# Patient Record
Sex: Male | Born: 1995 | Race: Black or African American | Hispanic: No | Marital: Single | Smoking: Current some day smoker
Health system: Southern US, Community
[De-identification: ages and names within clinical notes are randomized; demographics above are authoritative.]

---

## 2017-09-21 ENCOUNTER — Encounter (HOSPITAL_COMMUNITY): Payer: Self-pay

## 2017-09-21 ENCOUNTER — Emergency Department (HOSPITAL_COMMUNITY)
Admission: EM | Admit: 2017-09-21 | Discharge: 2017-09-21 | Disposition: A | Discharge status: Home / Self Care | Payer: Self-pay | Attending: Emergency Medicine | Admitting: Emergency Medicine

## 2017-09-21 ENCOUNTER — Other Ambulatory Visit: Payer: Self-pay

## 2017-09-21 ENCOUNTER — Emergency Department (HOSPITAL_COMMUNITY): Payer: Self-pay

## 2017-09-21 DIAGNOSIS — W2209XA Striking against other stationary object, initial encounter: Secondary | ICD-10-CM | POA: Insufficient documentation

## 2017-09-21 DIAGNOSIS — Y929 Unspecified place or not applicable: Secondary | ICD-10-CM | POA: Insufficient documentation

## 2017-09-21 DIAGNOSIS — M79641 Pain in right hand: Secondary | ICD-10-CM

## 2017-09-21 DIAGNOSIS — S60519A Abrasion of unspecified hand, initial encounter: Secondary | ICD-10-CM

## 2017-09-21 DIAGNOSIS — Y998 Other external cause status: Secondary | ICD-10-CM | POA: Insufficient documentation

## 2017-09-21 DIAGNOSIS — S60511A Abrasion of right hand, initial encounter: Secondary | ICD-10-CM | POA: Insufficient documentation

## 2017-09-21 DIAGNOSIS — Y9389 Activity, other specified: Secondary | ICD-10-CM | POA: Insufficient documentation

## 2017-09-21 NOTE — ED Triage Notes (Signed)
Per Pt, Pt is coming from home with complaints of right hand pain secondary to hitting a brick wall. Pt has abrasion noted to the right hand.

## 2017-09-21 NOTE — ED Provider Notes (Signed)
[facility]ENC[facility] Provider Note   CSN: [id number] Arrival date & time: 09/21/17  1253     History   Chief Complaint Chief Complaint  Patient presents with  . Hand Injury    HPI Lee Olson is a 22 y.o. male.  Lee Olson is a 22 y.o. Male who is otherwise healthy, presents to the ED for evaluation of right hand pain after punching a brick wall.  Patient reports he punched the wall because he was angry, since then he has had pain over his knuckles, pain is constant dull ache and not improving.  No pain at the wrist, patient has full use of his hand and is able to move all of his fingers. Patient does report some abrasions over his knuckles.  No numbness, tingling or weakness in the hand.  No other injuries.  No meds prior to arrival.  No other aggravating or alleviating factors.      History reviewed. No pertinent past medical history.  There are no active problems to display for this patient.   History reviewed. No pertinent surgical history.     Home Medications    Prior to Admission medications   Not on File    Family History No family history on file.  Social History Social History   Tobacco Use  . Smoking status: Never Smoker  . Smokeless tobacco: Never Used  Substance Use Topics  . Alcohol use: No    Frequency: Never  . Drug use: No     Allergies   Patient has no known allergies.   Review of Systems Review of Systems  Constitutional: Negative for chills and fever.  Musculoskeletal: Positive for arthralgias (R hand pain). Negative for joint swelling.  Skin: Positive for wound (Abrasions to knuckles in right hand).  Neurological: Negative for weakness and numbness.     Physical Exam Updated Vital Signs BP (!) 95/55 (BP Location: Left Arm)   Pulse 92   Temp 99.1 F (37.3 C) (Oral)   Resp 14   Ht 5\' 8"  (1.727 m)   Wt 59 kg (130 lb)   SpO2 99%   BMI 19.77 kg/m   Physical Exam  Constitutional: He  appears well-developed and well-nourished. No distress.  HENT:  Head: Normocephalic and atraumatic.  Eyes: Right eye exhibits no discharge. Left eye exhibits no discharge.  Pulmonary/Chest: Effort normal. No respiratory distress.  Musculoskeletal:  Palpation over the knuckles, no palpable deformity, 3 small surface abrasions present, no pain on palpation of the fingers, metacarpals or the wrist, full range of motion of the wrist and fingers, radial pulse 2+, good capillary refill, sensation intact.  Neurological: He is alert. Coordination normal.  Skin: Skin is warm and dry. Capillary refill takes less than 2 seconds. He is not diaphoretic.  Psychiatric: He has a normal mood and affect. His behavior is normal.  Nursing note and vitals reviewed.    ED Treatments / Results  Labs (all labs ordered are listed, but only abnormal results are displayed) Labs Reviewed - No data to display  EKG  EKG Interpretation None       Radiology Dg Hand Complete Right  Result Date: 09/21/2017 CLINICAL DATA:  Pt punched a brick wall with his right fist today - having pain and swelling around the 4th and 5th MCP joint areas - also some abrasions in those areas EXAM: RIGHT HAND - COMPLETE 3+ VIEW COMPARISON:  None. FINDINGS: There is no evidence of fracture or dislocation. There is  no evidence of arthropathy or other focal bone abnormality. Soft tissues are unremarkable. IMPRESSION: No acute osseous injury of the right hand. Electronically Signed   By: Elige Ko   On: 09/21/2017 13:57    Procedures Procedures (including critical care time)  Medications Ordered in ED Medications - No data to display   Initial Impression / Assessment and Plan / ED Course  I have reviewed the triage vital signs and the nursing notes.  Pertinent labs & imaging results that were available during my care of the patient were reviewed by me and considered in my medical decision making (see chart for  details).  Patient presents for evaluation of right hand pain punching a brick wall.  Hand is completely neurovascularly intact.  X-ray shows no evidence of acute fracture.  3 small surface abrasions washed out with soap and water, bacitracin ointment applied as well as dressing.  Patient to use ibuprofen or Tylenol as needed for pain.  Return precautions discussed with the patient.  He expressed understanding and is in agreement with plan, will follow up with PCP.     Final Clinical Impressions(s) / ED Diagnoses   Final diagnoses:  Right hand pain  Abrasion, hand w/o infection    ED Discharge Orders    None        Dartha Lodge, [place] 09/21/17 1432    Mancel Bale, MD 09/23/17 [phone]

## 2017-09-21 NOTE — Discharge Instructions (Signed)
Your x-ray shows no evidence of fracture.  Please keep abrasions covered, clean and dry until scabs form, if you note any swelling, redness, warmth or drainage from the wounds please return to the ED for reevaluation.  Otherwise you may establish care at [facility].  Please find healthier ways to express your anger.

## 2017-11-20 ENCOUNTER — Other Ambulatory Visit: Payer: Self-pay

## 2017-11-20 ENCOUNTER — Emergency Department (HOSPITAL_COMMUNITY)
Admission: EM | Admit: 2017-11-20 | Discharge: 2017-11-20 | Disposition: A | Discharge status: Home / Self Care | Payer: Self-pay | Attending: Emergency Medicine | Admitting: Emergency Medicine

## 2017-11-20 ENCOUNTER — Emergency Department (HOSPITAL_COMMUNITY): Payer: Self-pay

## 2017-11-20 ENCOUNTER — Encounter (HOSPITAL_COMMUNITY): Payer: Self-pay

## 2017-11-20 DIAGNOSIS — F17200 Nicotine dependence, unspecified, uncomplicated: Secondary | ICD-10-CM | POA: Insufficient documentation

## 2017-11-20 DIAGNOSIS — R059 Cough, unspecified: Secondary | ICD-10-CM

## 2017-11-20 DIAGNOSIS — R05 Cough: Secondary | ICD-10-CM | POA: Insufficient documentation

## 2017-11-20 MED ORDER — ALBUTEROL SULFATE HFA 108 (90 BASE) MCG/ACT IN AERS: 1.0000 | INHALATION_SPRAY | Freq: Four times a day (QID) | RESPIRATORY_TRACT | 0 refills | Status: AC | PRN

## 2017-11-20 MED ORDER — BENZONATATE 100 MG PO CAPS: 100.0000 mg | ORAL_CAPSULE | Freq: Three times a day (TID) | ORAL | 0 refills | Status: DC

## 2017-11-20 NOTE — Discharge Instructions (Signed)
Take albuterol inhaler as directed for cough.  Use Tessalon Perles as directed for cough.  Make sure you are staying hydrated and drink plenty of fluids.  [facility] for [facility].  Return to the emergency department for any fever, persistent vomiting, inability to eat or drink, abdominal pain, chest pain or any other worsening or concerning symptoms.

## 2017-11-20 NOTE — ED Triage Notes (Signed)
Pt states that he has had coughing and vomiting for a few days. Pt reports vomiting 3X in last 24 hours. Denies diarrhea. Cough is nonproductive.

## 2017-11-20 NOTE — ED Provider Notes (Signed)
[facility] EMERGENCY DEPARTMENT Provider Note   CSN: [id number] Arrival date & time: 11/20/17  1147     History   Chief Complaint Chief Complaint  Patient presents with  . Cough    HPI Lee Olson is a 22 y.o. male who presents for evaluation of cough that is been ongoing for the last 3 days.  Patient reports that after several episodes of coughing, he will have vomiting.  Patient only has vomiting after coughing.  He has been able to eat and drink without difficulty.  No vomiting at other times.  He reports the cough is nonproductive.  Patient reports that he had an albuterol inhaler that he had been using for cough but states he ran out. Patient states he is not having fevers.  Patient denies any chest pain, difficulty breathing, abdominal pain, sore throat, nasal congestion.  The history is provided by the patient.    History reviewed. No pertinent past medical history.  There are no active problems to display for this patient.   History reviewed. No pertinent surgical history.      Home Medications    Prior to Admission medications   Medication Sig Start Date End Date Taking? Authorizing Provider  albuterol (PROVENTIL HFA;VENTOLIN HFA) 108 (90 Base) MCG/ACT inhaler Inhale 1-2 puffs into the lungs every 6 (six) hours as needed for wheezing or shortness of breath. 11/20/17   Maxwell Caul, PA-C  benzonatate (TESSALON) 100 MG capsule Take 1 capsule (100 mg total) by mouth every 8 (eight) hours. 11/20/17   Maxwell Caul, PA-C    Family History No family history on file.  Social History Social History   Tobacco Use  . Smoking status: Current Some Day Smoker  . Smokeless tobacco: Never Used  Substance Use Topics  . Alcohol use: No    Frequency: Never  . Drug use: No     Allergies   Patient has no known allergies.   Review of Systems Review of Systems  Constitutional: Negative for fever.  HENT: Negative for congestion and  rhinorrhea.   Respiratory: Positive for cough. Negative for shortness of breath.   Cardiovascular: Negative for chest pain.  Gastrointestinal: Positive for vomiting. Negative for diarrhea.     Physical Exam Updated Vital Signs BP 113/82 (BP Location: Right Arm)   Pulse 86   Temp 98.3 F (36.8 C) (Oral)   Ht 5\' 8"  (1.727 m)   Wt 59 kg (130 lb)   SpO2 98%   BMI 19.77 kg/m   Physical Exam  Constitutional: He appears well-developed and well-nourished.  HENT:  Head: Normocephalic and atraumatic.  Nose: Mucosal edema present.  Mouth/Throat: Uvula is midline, oropharynx is clear and moist and mucous membranes are normal. No trismus in the jaw.  Eyes: Conjunctivae and EOM are normal. Right eye exhibits no discharge. Left eye exhibits no discharge. No scleral icterus.  Cardiovascular: Normal rate, regular rhythm and normal pulses.  Pulmonary/Chest: Effort normal and breath sounds normal. He has no wheezes.  No evidence of respiratory distress. Able to speak in full sentences without difficulty.  Abdominal: Soft. Normal appearance and bowel sounds are normal. There is no tenderness.  Abdomen is soft, non-distended, non-tender.  No rigidity, guarding.  Neurological: He is alert.  Skin: Skin is warm and dry.  Psychiatric: He has a normal mood and affect. His speech is normal and behavior is normal.  Nursing note and vitals reviewed.    ED Treatments / Results  Labs (all labs  ordered are listed, but only abnormal results are displayed) Labs Reviewed - No data to display  EKG None  Radiology Dg Chest 2 View  Result Date: 11/20/2017 CLINICAL DATA:  Cough and vomiting for the past 3 days. EXAM: CHEST - 2 VIEW COMPARISON:  None. FINDINGS: Normal sized heart. Clear lungs. Mild central peribronchial thickening. Unremarkable bones. IMPRESSION: Mild bronchitic changes. Electronically Signed   By: Beckie SaltsSteven  Reid M.D.   On: 11/20/2017 13:50    Procedures Procedures (including critical  care time)  Medications Ordered in ED Medications - No data to display   Initial Impression / Assessment and Plan / ED Course  I have reviewed the triage vital signs and the nursing notes.  Pertinent labs & imaging results that were available during my care of the patient were reviewed by me and considered in my medical decision making (see chart for details).     22 year old male who presents for evaluation of cough.  Patient also reports he is having episodes of posttussive emesis.  Patient reports he is only having episodes of emesis after coughing.  No other times.  He is able to eat and drink without any difficulty.  No abdominal pain, fevers.  Patient reports history of bronchitis.  Has an albuterol that he had been using but states he ran out.  No other medications noted. Patient is afebrile, non-toxic appearing, sitting comfortably on examination table. Vital signs reviewed and stable.  On exam, lungs clear to auscultate bilaterally.  No evidence of wheezing.  Abdomen is soft, nondistended, nontender.  Suspect this may be an upper respiratory versus bronchitis with posttussive emesis.  History/physical exam is not concerning for acute viral GI process, appendicitis or diverticulitis.  Plan for chest x-ray for evaluation.  Chest x-ray reviewed.  Negative for any acute abnormalities.  I discussed results with patient.  We will plan to send home home with symptomatic relief for cough.  Instructed him to follow-up with primary care doctor.  Provide [facility] for further primary care evaluation. Patient had ample opportunity for questions and discussion. All patient's questions were answered with full understanding. Strict return precautions discussed. Patient expresses understanding and agreement to plan.    Final Clinical Impressions(s) / ED Diagnoses   Final diagnoses:  Cough    ED Discharge Orders        Ordered    benzonatate (TESSALON) 100 MG  capsule  Every 8 hours     11/20/17 1421    albuterol (PROVENTIL HFA;VENTOLIN HFA) 108 (90 Base) MCG/ACT inhaler  Every 6 hours PRN     11/20/17 1421       Maxwell CaulLayden, Lindsey A, PA-C 11/20/17 1749    Alvira MondaySchlossman, Erin, MD 11/20/17 2342

## 2018-01-17 ENCOUNTER — Emergency Department (HOSPITAL_COMMUNITY)
Admission: EM | Admit: 2018-01-17 | Discharge: 2018-01-17 | Disposition: A | Discharge status: Home / Self Care | Payer: Self-pay | Attending: Emergency Medicine | Admitting: Emergency Medicine

## 2018-01-17 ENCOUNTER — Other Ambulatory Visit: Payer: Self-pay

## 2018-01-17 DIAGNOSIS — S6991XA Unspecified injury of right wrist, hand and finger(s), initial encounter: Secondary | ICD-10-CM

## 2018-01-17 DIAGNOSIS — M79641 Pain in right hand: Secondary | ICD-10-CM | POA: Insufficient documentation

## 2018-01-17 NOTE — ED Notes (Signed)
Pt called for triage x2, no answer. 

## 2018-01-17 NOTE — ED Notes (Signed)
Patient called x 2 with no answer 

## 2018-01-17 NOTE — ED Provider Notes (Signed)
Patient placed in Quick Look pathway, seen and evaluated   Chief Complaint: Pt reports he jammed his finger.  Pt complains of pain in his hand and 3rd finger.  Pt has pain with movement.   HPI:   Pt complains of continued swelling and pain in hie right hand  ROS: no numbness of tingling.    Physical Exam:   Gen: No distress  Neuro: Awake and Alert  Skin: Warm    Focused Exam: tender 3rd finger at base, pain with moving  his hand. nv and ns intact   Initiation of care has begun. The patient has been counseled on the process, plan, and necessity for staying for the completion/evaluation, and the remainder of the medical screening examination     Osie CheeksSofia, Mayme Profeta K, PA-C 01/17/18 1739    Wynetta FinesMessick, Peter C, MD 01/25/18 [phone]

## 2018-01-18 ENCOUNTER — Emergency Department (HOSPITAL_COMMUNITY)
Admission: EM | Admit: 2018-01-18 | Discharge: 2018-01-18 | Disposition: A | Discharge status: Home / Self Care | Payer: Self-pay | Attending: Emergency Medicine | Admitting: Emergency Medicine

## 2018-01-18 ENCOUNTER — Emergency Department (HOSPITAL_COMMUNITY): Payer: Self-pay

## 2018-01-18 ENCOUNTER — Encounter (HOSPITAL_COMMUNITY): Payer: Self-pay | Admitting: *Deleted

## 2018-01-18 DIAGNOSIS — W2209XA Striking against other stationary object, initial encounter: Secondary | ICD-10-CM | POA: Insufficient documentation

## 2018-01-18 DIAGNOSIS — S6991XA Unspecified injury of right wrist, hand and finger(s), initial encounter: Secondary | ICD-10-CM | POA: Insufficient documentation

## 2018-01-18 DIAGNOSIS — Y939 Activity, unspecified: Secondary | ICD-10-CM | POA: Insufficient documentation

## 2018-01-18 DIAGNOSIS — F17210 Nicotine dependence, cigarettes, uncomplicated: Secondary | ICD-10-CM | POA: Insufficient documentation

## 2018-01-18 DIAGNOSIS — Y999 Unspecified external cause status: Secondary | ICD-10-CM | POA: Insufficient documentation

## 2018-01-18 DIAGNOSIS — Y929 Unspecified place or not applicable: Secondary | ICD-10-CM | POA: Insufficient documentation

## 2018-01-18 NOTE — ED Provider Notes (Signed)
[facility] Provider Note   CSN: [id number] Arrival date & time: 01/18/18  1312    History   Chief Complaint Chief Complaint  Patient presents with  . Hand Injury    HPI Lee Olson is a 22 y.o. male.  HPI   22 year old male presents today with complaints of right hand.  Patient notes that he punched a wall today causing pain at the right fifth MCP.  He denies any open wounds, denies any pain throughout the rest of the hand or wrist.  No medications prior to arrival.  Patient is right-hand dominant works at [employer].    History reviewed. No pertinent past medical history.  There are no active problems to display for this patient.   History reviewed. No pertinent surgical history.      Home Medications    Prior to Admission medications   Medication Sig Start Date End Date Taking? Authorizing Provider  albuterol (PROVENTIL HFA;VENTOLIN HFA) 108 (90 Base) MCG/ACT inhaler Inhale 1-2 puffs into the lungs every 6 (six) hours as needed for wheezing or shortness of breath. 11/20/17   Maxwell CaulLayden, Lindsey A, PA-C  benzonatate (TESSALON) 100 MG capsule Take 1 capsule (100 mg total) by mouth every 8 (eight) hours. 11/20/17   Maxwell CaulLayden, Lindsey A, PA-C    Family History History reviewed. No pertinent family history.  Social History Social History   Tobacco Use  . Smoking status: Current Some Day Smoker  . Smokeless tobacco: Never Used  Substance Use Topics  . Alcohol use: No    Frequency: Never  . Drug use: No     Allergies   Patient has no known allergies.   Review of Systems Review of Systems  All other systems reviewed and are negative.    Physical Exam Updated Vital Signs BP 104/78 (BP Location: Right Arm)   Pulse (!) 58   Temp 98 F (36.7 C) (Oral)   Resp 20   SpO2 100%   Physical Exam  Constitutional: He is oriented to person, place, and time. He appears well-developed and well-nourished.  HENT:  Head:  Normocephalic and atraumatic.  Eyes: Pupils are equal, round, and reactive to light. Conjunctivae are normal. Right eye exhibits no discharge. Left eye exhibits no discharge. No scleral icterus.  Neck: Normal range of motion. No JVD present. No tracheal deviation present.  Pulmonary/Chest: Effort normal. No stridor.  Musculoskeletal:  Minor swelling over the right fifth MCP, full active range of motion of the fingers made her hand nontender, tenderness palpation of the MCP-no wrist or elbow tenderness  Neurological: He is alert and oriented to person, place, and time. Coordination normal.  Psychiatric: He has a normal mood and affect. His behavior is normal. Judgment and thought content normal.  Nursing note and vitals reviewed.    ED Treatments / Results  Labs (all labs ordered are listed, but only abnormal results are displayed) Labs Reviewed - No data to display  EKG None  Radiology Dg Hand Complete Right  Result Date: 01/18/2018 CLINICAL DATA:  Patient reported punching a wall with the right hand today with persistent swelling over the fifth MCP joint EXAM: RIGHT HAND - COMPLETE 3+ VIEW COMPARISON:  Right hand series of September 21, 2017 FINDINGS: The bones of the right hand are subjectively adequately mineralized. Specific attention to the fifth metacarpal reveals no acute fracture. There is considerable soft tissue swelling over the fifth MCP joint. The phalanges and first through fourth metacarpals are unremarkable. The carpal bones  are intact. IMPRESSION: There is no acute fracture or dislocation of the right hand. There is a large amount of soft tissue swelling over the fifth MCP joint. Electronically Signed   By: David  [place] M.D.   On: 01/18/2018 13:37    Procedures Procedures (including critical care time)  Medications Ordered in ED Medications - No data to display   Initial Impression / Assessment and Plan / ED Course  I have reviewed the triage vital signs and the  nursing notes.  Pertinent labs & imaging results that were available during my care of the patient were reviewed by me and considered in my medical decision making (see chart for details).     Labs:   Imaging: DG Hand   Consults:  Therapeutics:  Discharge Meds:   Assessment/Plan: 22 year old male presents today with hand injury.  Soft tissue swelling noted no acute bony abnormalities I personally reviewed the x-ray myself.  Patient discharged with symptomatic care and return precautions.  He verbalized understanding and agreement to today's plan had no further discharge.      Final Clinical Impressions(s) / ED Diagnoses   Final diagnoses:  Injury of right hand, initial encounter    ED Discharge Orders    None       Rosalio Loud 01/18/18 1359    Tilden Fossa, MD 01/19/18 [phone]

## 2018-01-18 NOTE — ED Notes (Signed)
Pt alert and oriented in NAD. Pt verbalized understanding of discharge instructions. 

## 2018-01-18 NOTE — ED Notes (Signed)
Pt called to go back to room. Pt in X-ray at this time. X-ray will bring pt back to room when finished.

## 2018-01-18 NOTE — Discharge Instructions (Addendum)
Please read attached information. If you experience any new or worsening signs or symptoms please return to the emergency room for evaluation. Please follow-up with your primary care provider or specialist as discussed.  °
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
## 2018-01-24 ENCOUNTER — Encounter (HOSPITAL_COMMUNITY): Payer: Self-pay

## 2018-01-24 ENCOUNTER — Other Ambulatory Visit: Payer: Self-pay

## 2018-01-24 ENCOUNTER — Emergency Department (HOSPITAL_COMMUNITY)
Admission: EM | Admit: 2018-01-24 | Discharge: 2018-01-24 | Disposition: A | Discharge status: Home / Self Care | Payer: Self-pay | Attending: Physician Assistant | Admitting: Physician Assistant

## 2018-01-24 DIAGNOSIS — S60511A Abrasion of right hand, initial encounter: Secondary | ICD-10-CM | POA: Insufficient documentation

## 2018-01-24 DIAGNOSIS — T07XXXA Unspecified multiple injuries, initial encounter: Secondary | ICD-10-CM

## 2018-01-24 DIAGNOSIS — S80212A Abrasion, left knee, initial encounter: Secondary | ICD-10-CM | POA: Insufficient documentation

## 2018-01-24 DIAGNOSIS — Y939 Activity, unspecified: Secondary | ICD-10-CM | POA: Insufficient documentation

## 2018-01-24 DIAGNOSIS — S60512A Abrasion of left hand, initial encounter: Secondary | ICD-10-CM | POA: Insufficient documentation

## 2018-01-24 DIAGNOSIS — S80211A Abrasion, right knee, initial encounter: Secondary | ICD-10-CM | POA: Insufficient documentation

## 2018-01-24 DIAGNOSIS — Z23 Encounter for immunization: Secondary | ICD-10-CM | POA: Insufficient documentation

## 2018-01-24 DIAGNOSIS — Y999 Unspecified external cause status: Secondary | ICD-10-CM | POA: Insufficient documentation

## 2018-01-24 DIAGNOSIS — Y929 Unspecified place or not applicable: Secondary | ICD-10-CM | POA: Insufficient documentation

## 2018-01-24 MED ORDER — TETANUS-DIPHTH-ACELL PERTUSSIS 5-2.5-18.5 LF-MCG/0.5 IM SUSP
0.5000 mL | Freq: Once | INTRAMUSCULAR | Status: AC
  Administered 2018-01-24: 0.5 mL via INTRAMUSCULAR
  Filled 2018-01-24: qty 0.5

## 2018-01-24 MED ORDER — LIDOCAINE HCL (PF) 1 % IJ SOLN
5.0000 mL | Freq: Once | INTRAMUSCULAR | Status: AC
  Administered 2018-01-24: 5 mL via INTRADERMAL
  Filled 2018-01-24: qty 5

## 2018-01-24 NOTE — ED Notes (Signed)
Wound soaking in saline and peroxide
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
## 2018-01-24 NOTE — Discharge Instructions (Signed)
Contact a health care provider if: You received a tetanus shot and you have swelling, severe pain, redness, or bleeding at the injection site. Your pain is not controlled with medicine. You have increased redness, swelling, or pain at the site of your wound. Get help right away if: You have a red streak going away from your wound. You have a fever. You have fluid, blood, or pus coming from your wound. You notice a bad smell coming from your wound or your dressing.
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
## 2018-08-13 ENCOUNTER — Emergency Department (HOSPITAL_COMMUNITY)
Admission: EM | Admit: 2018-08-13 | Discharge: 2018-08-13 | Disposition: A | Discharge status: Home / Self Care | Payer: No Typology Code available for payment source | Attending: Emergency Medicine | Admitting: Emergency Medicine

## 2018-08-13 ENCOUNTER — Emergency Department (HOSPITAL_COMMUNITY): Payer: No Typology Code available for payment source

## 2018-08-13 ENCOUNTER — Encounter (HOSPITAL_COMMUNITY): Payer: Self-pay | Admitting: Emergency Medicine

## 2018-08-13 DIAGNOSIS — S8002XA Contusion of left knee, initial encounter: Secondary | ICD-10-CM | POA: Diagnosis not present

## 2018-08-13 DIAGNOSIS — F17210 Nicotine dependence, cigarettes, uncomplicated: Secondary | ICD-10-CM | POA: Insufficient documentation

## 2018-08-13 DIAGNOSIS — Y999 Unspecified external cause status: Secondary | ICD-10-CM | POA: Insufficient documentation

## 2018-08-13 DIAGNOSIS — S8992XA Unspecified injury of left lower leg, initial encounter: Secondary | ICD-10-CM | POA: Diagnosis present

## 2018-08-13 DIAGNOSIS — Y92414 Local residential or business street as the place of occurrence of the external cause: Secondary | ICD-10-CM | POA: Diagnosis not present

## 2018-08-13 DIAGNOSIS — Y9389 Activity, other specified: Secondary | ICD-10-CM | POA: Insufficient documentation

## 2018-08-13 MED ORDER — IBUPROFEN 200 MG PO TABS
600.0000 mg | ORAL_TABLET | Freq: Once | ORAL | Status: AC
  Administered 2018-08-13: 600 mg via ORAL
  Filled 2018-08-13: qty 3

## 2018-08-13 MED ORDER — NAPROXEN 500 MG PO TABS: 500.0000 mg | ORAL_TABLET | Freq: Two times a day (BID) | ORAL | 0 refills | Status: AC
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
## 2019-11-01 ENCOUNTER — Encounter (HOSPITAL_COMMUNITY): Payer: Self-pay | Admitting: Psychiatric/Mental Health

## 2019-11-01 ENCOUNTER — Observation Stay (HOSPITAL_COMMUNITY)
Admission: AD | Admit: 2019-11-01 | Discharge: 2019-11-02 | Disposition: A | Discharge status: Home / Self Care | Payer: Self-pay | Attending: Psychiatry | Admitting: Psychiatry

## 2019-11-01 ENCOUNTER — Other Ambulatory Visit: Payer: Self-pay

## 2019-11-01 DIAGNOSIS — R4689 Other symptoms and signs involving appearance and behavior: Principal | ICD-10-CM | POA: Diagnosis present

## 2019-11-01 DIAGNOSIS — F17210 Nicotine dependence, cigarettes, uncomplicated: Secondary | ICD-10-CM | POA: Insufficient documentation

## 2019-11-01 DIAGNOSIS — Z791 Long term (current) use of non-steroidal anti-inflammatories (NSAID): Secondary | ICD-10-CM | POA: Insufficient documentation

## 2019-11-01 DIAGNOSIS — Z20822 Contact with and (suspected) exposure to covid-19: Secondary | ICD-10-CM | POA: Insufficient documentation

## 2019-11-01 LAB — RESPIRATORY PANEL BY RT PCR (FLU A&B, COVID)
Influenza A by PCR: NEGATIVE
Influenza B by PCR: NEGATIVE
SARS Coronavirus 2 by RT PCR: NEGATIVE

## 2019-11-01 MED ORDER — MAGNESIUM HYDROXIDE 400 MG/5ML PO SUSP: 30.0000 mL | Freq: Every day | ORAL | Status: DC | PRN

## 2019-11-01 MED ORDER — ACETAMINOPHEN 325 MG PO TABS: 650.0000 mg | ORAL_TABLET | Freq: Four times a day (QID) | ORAL | Status: DC | PRN

## 2019-11-01 MED ORDER — ALUM & MAG HYDROXIDE-SIMETH 200-200-20 MG/5ML PO SUSP: 30.0000 mL | ORAL | Status: DC | PRN

## 2019-11-01 MED ORDER — TRAZODONE HCL 50 MG PO TABS
50.0000 mg | ORAL_TABLET | Freq: Every evening | ORAL | Status: DC | PRN
  Administered 2019-11-01: 23:00:00 50 mg via ORAL
  Filled 2019-11-01: qty 1

## 2019-11-01 MED ORDER — HYDROXYZINE HCL 25 MG PO TABS
25.0000 mg | ORAL_TABLET | Freq: Three times a day (TID) | ORAL | Status: DC | PRN
  Administered 2019-11-01: 25 mg via ORAL
  Filled 2019-11-01: qty 1

## 2019-11-01 NOTE — Progress Notes (Signed)
Patient ID: Lee Olson, male   DOB: 1995-09-15, 24 y.o.   MRN: [id number] Pt A&O x 4, presents under IVC.  Pt has anger issues and as per mother is arguing with male family members and the girlfriend.  Pt diagnosed with ADHD as teenager.  Denies SI, HI or AVH.   Pt calm & cooperative at present.  Skin search completed, monitoring for safety.  No distress noted.
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
## 2019-11-01 NOTE — Plan of Care (Signed)
[facility] Observation Crisis Plan  Reason for Crisis Plan:  Crisis Stabilization   Plan of Care:  Referral for Inpatient Hospitalization  Family Support:      Current Living Environment:     Insurance:   Hospital Account    Name Acct ID Class Status Primary Coverage   Torrin, [place] [id number] BEHAVIORAL HEALTH OBSERVATION Open MED PAY - MED PAY ASSURANCE        Guarantor Account (for Hospital Account [id number])    Name Relation to Pt Service Area Active? Acct Type   Jill Poling Self [facility] Yes Behavioral Health   Address Phone       [address] [place], [place] [address] [phone](H)          Coverage Information (for Hospital Account [id number])    F/O Payor/Plan Precert #   MED PAY/MED PAY ASSURANCE    Subscriber Subscriber #   Jamesmichael, Shadd [id number]   Address Phone           Legal Guardian:  Legal Guardian: Other:  Primary Care Provider:  Patient, No Pcp Per  Current Outpatient Providers:  none  Psychiatrist:  Name of Psychiatrist: nome  Counselor/Therapist:  Name of Therapist: none  Compliant with Medications:  No  Additional Information:   Tasia Catchings 3/31/202111:28 PM
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
## 2019-11-02 LAB — COMPREHENSIVE METABOLIC PANEL
ALT: 16 U/L (ref 0–44)
AST: 16 U/L (ref 15–41)
Albumin: 4.3 g/dL (ref 3.5–5.0)
Alkaline Phosphatase: 69 U/L (ref 38–126)
Anion gap: 9 (ref 5–15)
BUN: 11 mg/dL (ref 6–20)
CO2: 24 mmol/L (ref 22–32)
Calcium: 9.1 mg/dL (ref 8.9–10.3)
Chloride: 105 mmol/L (ref 98–111)
Creatinine, Ser: 0.87 mg/dL (ref 0.61–1.24)
GFR calc Af Amer: >60 mL/min (ref >60)
GFR calc non Af Amer: >60 mL/min (ref >60)
Glucose, Bld: 94 mg/dL (ref 70–99)
Potassium: 4 mmol/L (ref 3.5–5.1)
Sodium: 138 mmol/L (ref 135–145)
Total Bilirubin: 1.8 mg/dL — ABNORMAL HIGH (ref 0.3–1.2)
Total Protein: 7.3 g/dL (ref 6.5–8.1)

## 2019-11-02 LAB — CBC
HCT: 47.4 % (ref 39.0–52.0)
Hemoglobin: 15.9 g/dL (ref 13.0–17.0)
MCH: 32.3 pg (ref 26.0–34.0)
MCHC: 33.5 g/dL (ref 30.0–36.0)
MCV: 96.3 fL (ref 80.0–100.0)
Platelets: 225 10*3/uL (ref 150–400)
RBC: 4.92 MIL/uL (ref 4.22–5.81)
RDW: 11.9 % (ref 11.5–15.5)
WBC: 5.3 10*3/uL (ref 4.0–10.5)
nRBC: 0 % (ref 0.0–0.2)

## 2019-11-02 LAB — HEPATIC FUNCTION PANEL
ALT: 15 U/L (ref 0–44)
AST: 18 U/L (ref 15–41)
Albumin: 4.3 g/dL (ref 3.5–5.0)
Alkaline Phosphatase: 69 U/L (ref 38–126)
Bilirubin, Direct: 0.2 mg/dL (ref 0.0–0.2)
Indirect Bilirubin: 1.3 mg/dL — ABNORMAL HIGH (ref 0.3–0.9)
Total Bilirubin: 1.5 mg/dL — ABNORMAL HIGH (ref 0.3–1.2)
Total Protein: 7.3 g/dL (ref 6.5–8.1)

## 2019-11-02 LAB — ETHANOL: Alcohol, Ethyl (B): <10 mg/dL (ref <10)

## 2019-11-02 LAB — HEMOGLOBIN A1C
Hgb A1c MFr Bld: 4.8 % (ref 4.8–5.6)
Mean Plasma Glucose: 91.06 mg/dL

## 2019-11-02 LAB — LIPID PANEL
Cholesterol: 153 mg/dL (ref 0–200)
HDL: 36 mg/dL — ABNORMAL LOW (ref >40)
LDL Cholesterol: 108 mg/dL — ABNORMAL HIGH (ref 0–99)
Total CHOL/HDL Ratio: 4.3 RATIO
Triglycerides: 46 mg/dL (ref <150)
VLDL: 9 mg/dL (ref 0–40)

## 2019-11-02 LAB — MAGNESIUM: Magnesium: 2.4 mg/dL (ref 1.7–2.4)

## 2019-11-02 LAB — TSH: TSH: 2.554 u[IU]/mL (ref 0.350–4.500)

## 2019-11-02 MED ORDER — FLUOXETINE HCL 20 MG PO CAPS: 20.0000 mg | ORAL_CAPSULE | Freq: Every day | ORAL | 2 refills | Status: AC
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
## 2019-11-02 NOTE — Discharge Instructions (Signed)
For your mental health needs, you are advised to follow up with [facility].  Call them at your earliest opportunity to schedule an intake appointment:       [facility]      [address]      [place], [place] [address]      [phone]      Crisis number: [phone]  

## 2019-11-02 NOTE — BH Assessment (Addendum)
[facility] Assessment Progress Note  Per Malvin Johns, MD, this pt does not require psychiatric hospitalization at this time.  Pt presents under IVC initiated by pt's sister, which Dr Jeannine Kitten has rescinded.  Pt is to be discharged from the [facility] with recommendation to follow up with [facility].  This has been included in pt's discharge instructions.  Pt's nurse, Danella Deis, has been notified.  Doylene Canning, MA Triage Specialist [phone]

## 2019-11-02 NOTE — Progress Notes (Signed)
Patient A&O x 4. Denies SI/HI/AVH upon discharge. Received discharge education and follow up instructions. Patient verbalized understanding and compliance. RX received with understanding voiced by patient. All personal belongings returned to pt intact. Pt left in personal vehicle transport. Safety maintained.
